# Patient Record
Sex: Female | Born: 1995 | Race: White | Hispanic: No | Marital: Single | Smoking: Never smoker
Health system: Southern US, Community
[De-identification: ages and names within clinical notes are randomized; demographics above are authoritative.]

---

## 2015-01-07 ENCOUNTER — Ambulatory Visit (INDEPENDENT_AMBULATORY_CARE_PROVIDER_SITE_OTHER): Payer: 59 | Admitting: Women's Health

## 2015-01-07 ENCOUNTER — Encounter: Payer: Self-pay | Admitting: Women's Health

## 2015-01-07 VITALS — BP 124/78 | Ht 60.0 in | Wt 155.0 lb

## 2015-01-07 DIAGNOSIS — Z30019 Encounter for initial prescription of contraceptives, unspecified: Secondary | ICD-10-CM

## 2015-01-07 DIAGNOSIS — Z113 Encounter for screening for infections with a predominantly sexual mode of transmission: Secondary | ICD-10-CM | POA: Diagnosis not present

## 2015-01-07 DIAGNOSIS — N920 Excessive and frequent menstruation with regular cycle: Secondary | ICD-10-CM | POA: Diagnosis not present

## 2015-01-07 DIAGNOSIS — Z01419 Encounter for gynecological examination (general) (routine) without abnormal findings: Secondary | ICD-10-CM

## 2015-01-07 LAB — CBC WITH DIFFERENTIAL/PLATELET
BASOS ABS: 0.1 10*3/uL (ref 0.0–0.1)
BASOS PCT: 1 % (ref 0–1)
EOS ABS: 0.3 10*3/uL (ref 0.0–0.7)
Eosinophils Relative: 5 % (ref 0–5)
HEMATOCRIT: 40.8 % (ref 36.0–46.0)
Hemoglobin: 13.9 g/dL (ref 12.0–15.0)
Lymphocytes Relative: 29 % (ref 12–46)
Lymphs Abs: 1.9 10*3/uL (ref 0.7–4.0)
MCH: 29.4 pg (ref 26.0–34.0)
MCHC: 34.1 g/dL (ref 30.0–36.0)
MCV: 86.3 fL (ref 78.0–100.0)
MONO ABS: 0.6 10*3/uL (ref 0.1–1.0)
MONOS PCT: 9 % (ref 3–12)
MPV: 9.6 fL (ref 8.6–12.4)
NEUTROS PCT: 56 % (ref 43–77)
Neutro Abs: 3.7 10*3/uL (ref 1.7–7.7)
PLATELETS: 285 10*3/uL (ref 150–400)
RBC: 4.73 MIL/uL (ref 3.87–5.11)
RDW: 13.1 % (ref 11.5–15.5)
WBC: 6.6 10*3/uL (ref 4.0–10.5)

## 2015-01-07 LAB — TSH: TSH: 0.495 u[IU]/mL (ref 0.350–4.500)

## 2015-01-07 MED ORDER — ETONOGESTREL-ETHINYL ESTRADIOL 0.12-0.015 MG/24HR VA RING: VAGINAL_RING | VAGINAL | Status: AC

## 2015-01-07 NOTE — Patient Instructions (Signed)

## 2015-01-07 NOTE — Progress Notes (Signed)
Darrell Jewelshley Tortorelli [birth year] [id number]    History:    Presents for annual exam.  Monthly heavy 7-8 day cycle. Cycles always have been long, did not tolerate pills well. Gardasil series completed. New partner in the past year.  Past medical history, past surgical history, family history and social history were all reviewed and documented in the EPIC chart. Student at [employer] undecided major. Parents healthy.  ROS:  A ROS was performed and pertinent positives and negatives are included.  Exam:  Filed Vitals:   01/07/15 1138  BP: 124/78    General appearance:  Normal Thyroid:  Symmetrical, normal in size, without palpable masses or nodularity. Respiratory  Auscultation:  Clear without wheezing or rhonchi Cardiovascular  Auscultation:  Regular rate, without rubs, murmurs or gallops  Edema/varicosities:  Not grossly evident Abdominal  Soft,nontender, without masses, guarding or rebound.  Liver/spleen:  No organomegaly noted  Hernia:  None appreciated  Skin  Inspection:  Grossly normal   Breasts: Examined lying and sitting.     Right: Without masses, retractions, discharge or axillary adenopathy.     Left: Without masses, retractions, discharge or axillary adenopathy. Gentitourinary   Inguinal/mons:  Normal without inguinal adenopathy  External genitalia:  Normal  BUS/Urethra/Skene's glands:  Normal  Vagina:  Normal  Cervix:  Normal  Uterus:   normal in size, shape and contour.  Midline and mobile  Adnexa/parametria:     Rt: Without masses or tenderness.   Lt: Without masses or tenderness.  Anus and perineum: Normal   Assessment/Plan:  19 y.o. S [place] G0 for annual exam.    Menorrhagia/condoms STD screen  Plan: Contraception options reviewed, will try NuvaRing, prescription, proper use, slight risk for blood clots and strokes reviewed. Start days 5 of next cycle continue condom use. Instructed to call if cycles do not lighten after several months. SBE's, regular exercise,  calcium rich diet, MVI daily encouraged. Campus and driving safety reviewed. CBC, TSH, prolactin, UA, GC/Chlamydia, HIV, hep B, C, RPR.    Harrington ChallengerYOUNG,Ameila Weldon J WHNP, 1:04 PM 01/07/2015

## 2015-01-08 LAB — HEPATITIS C ANTIBODY: HCV Ab: NEGATIVE

## 2015-01-08 LAB — PROLACTIN: PROLACTIN: 5.7 ng/mL

## 2015-01-08 LAB — HIV ANTIBODY (ROUTINE TESTING W REFLEX): HIV 1&2 Ab, 4th Generation: NONREACTIVE

## 2015-01-08 LAB — RPR

## 2015-01-08 LAB — HEPATITIS B SURFACE ANTIGEN: Hepatitis B Surface Ag: NEGATIVE

## 2015-01-09 LAB — GC/CHLAMYDIA PROBE AMP
CT Probe RNA: NEGATIVE
GC PROBE AMP APTIMA: NEGATIVE

## 2015-04-24 ENCOUNTER — Other Ambulatory Visit: Payer: Self-pay | Admitting: Gastroenterology

## 2015-04-24 DIAGNOSIS — R1084 Generalized abdominal pain: Secondary | ICD-10-CM

## 2015-04-29 ENCOUNTER — Ambulatory Visit
Admission: RE | Admit: 2015-04-29 | Discharge: 2015-04-29 | Disposition: A | Discharge status: Home / Self Care | Payer: 59 | Source: Ambulatory Visit | Attending: Gastroenterology | Admitting: Gastroenterology

## 2015-04-29 DIAGNOSIS — R1084 Generalized abdominal pain: Secondary | ICD-10-CM

## 2016-08-12 IMAGING — US US ABDOMEN COMPLETE
1 series · 14 of 25 positions shown · non-contrast
Comparison: None.

CLINICAL DATA: Abdominal pain.

EXAM:
ULTRASOUND ABDOMEN COMPLETE

[Series 1: us abdomen complete · 0.19mm/px · 14 of 78 slices shown]
[im 1/78]
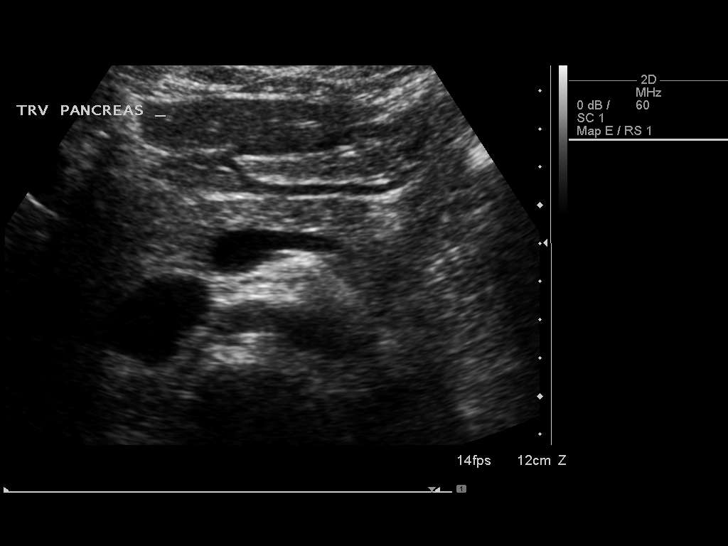
[im 7/78]
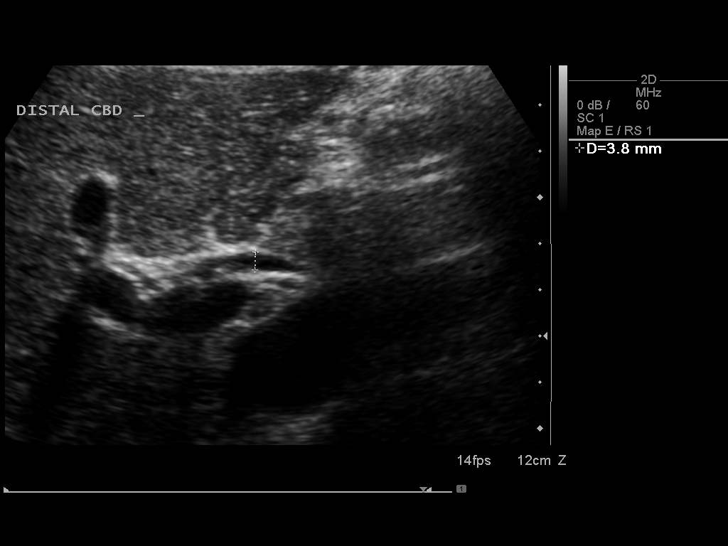
[im 13/78]
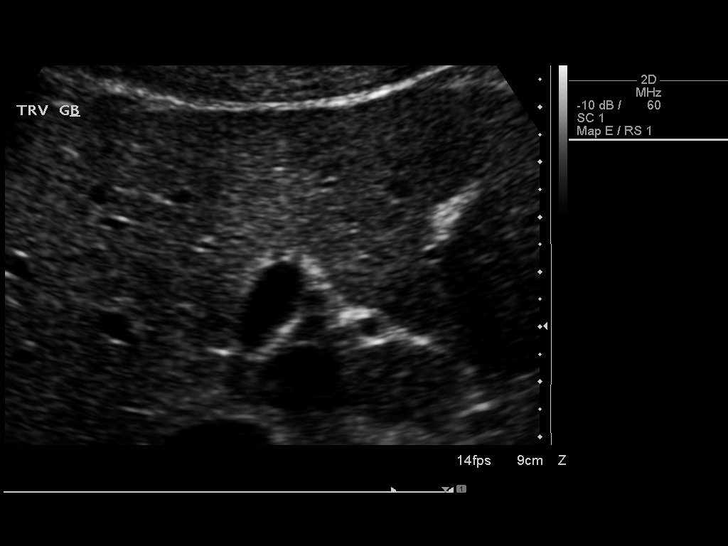
[im 20/78]
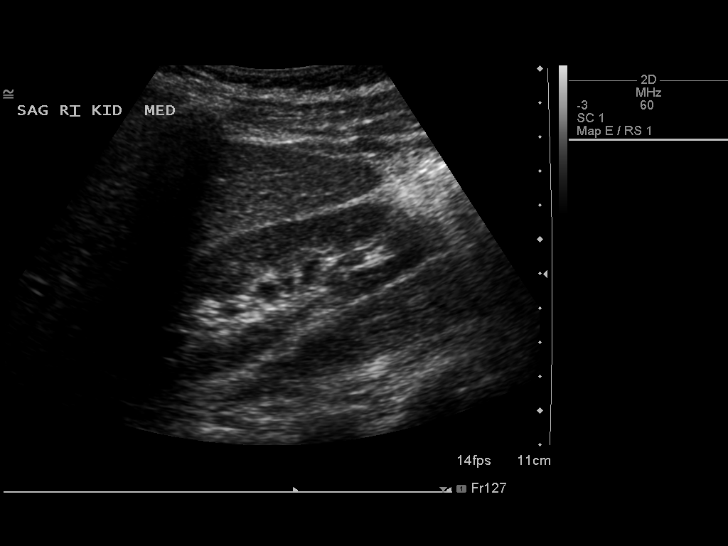
[im 26/78]
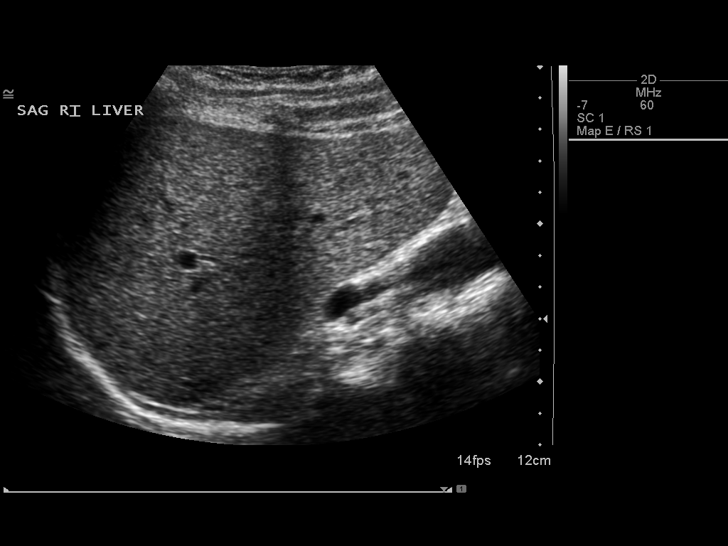
[im 29/78]
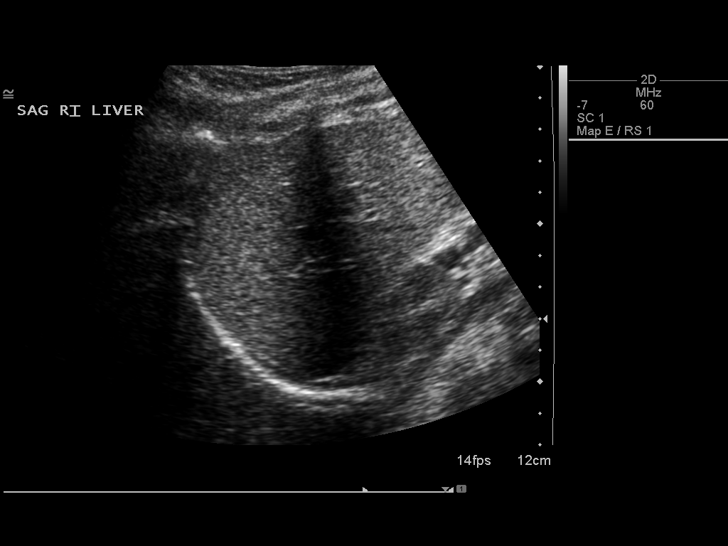
[im 36/78]
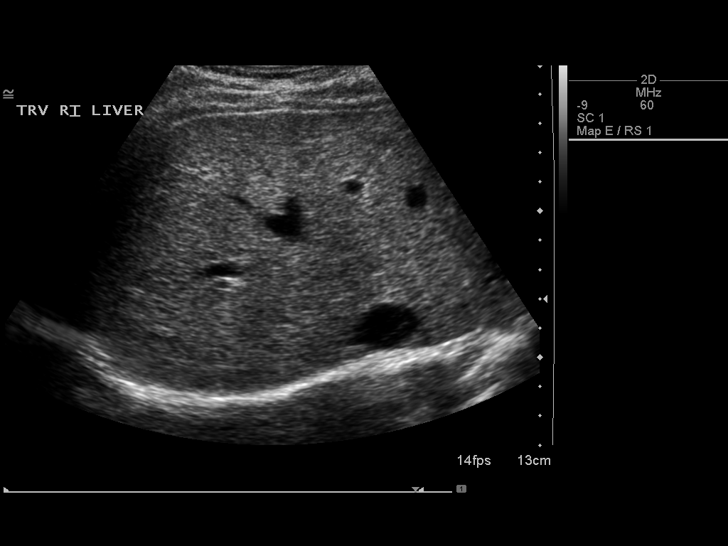
[im 42/78]
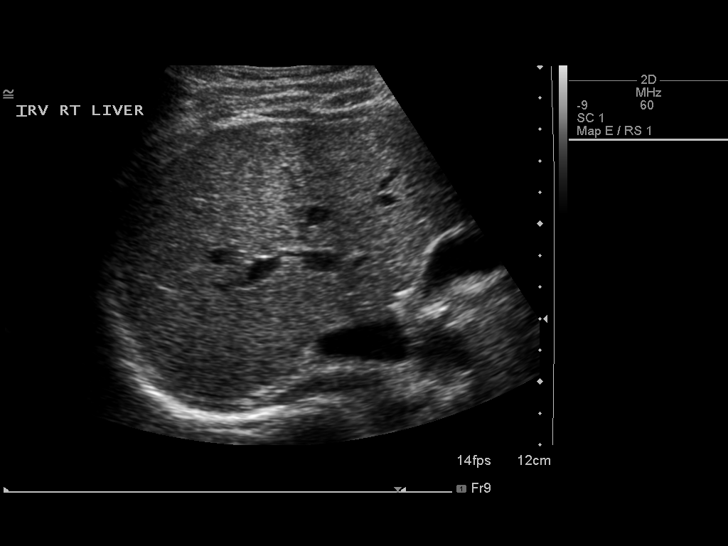
[im 49/78]
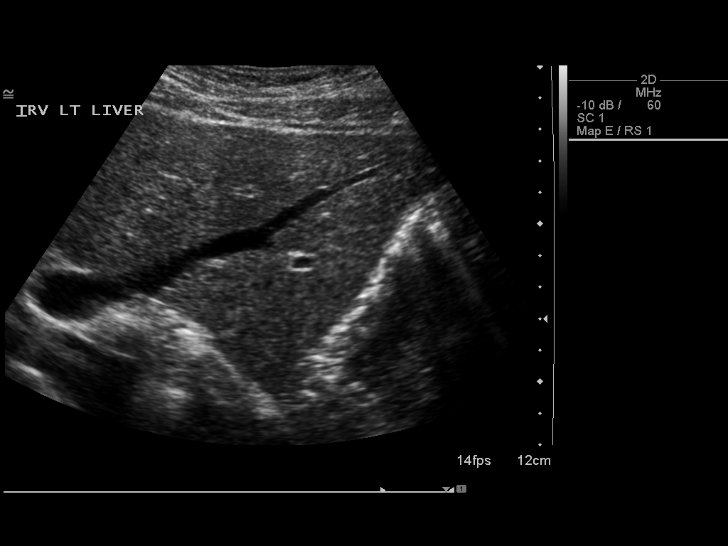
[im 52/78]
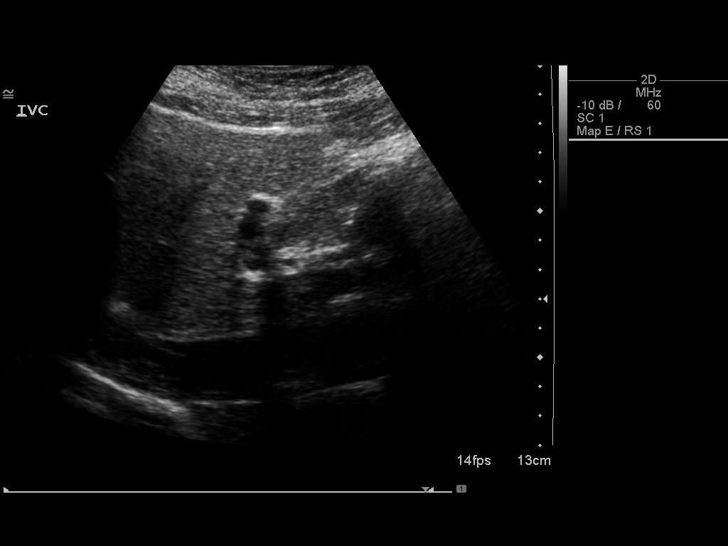
[im 58/78]
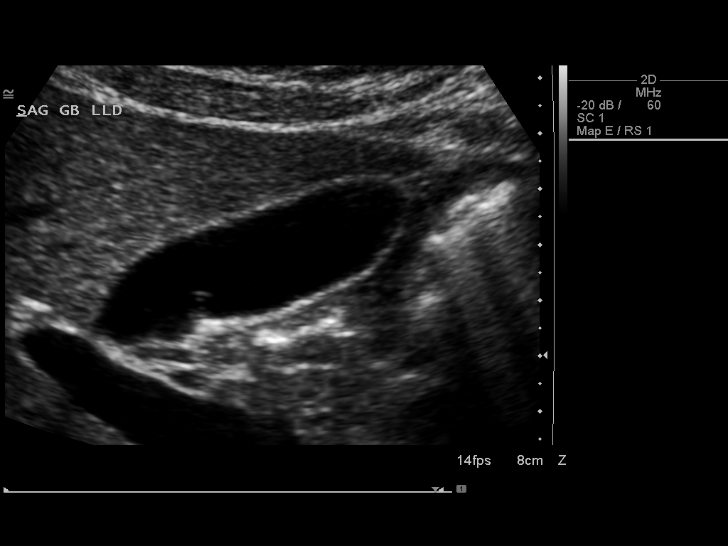
[im 65/78]
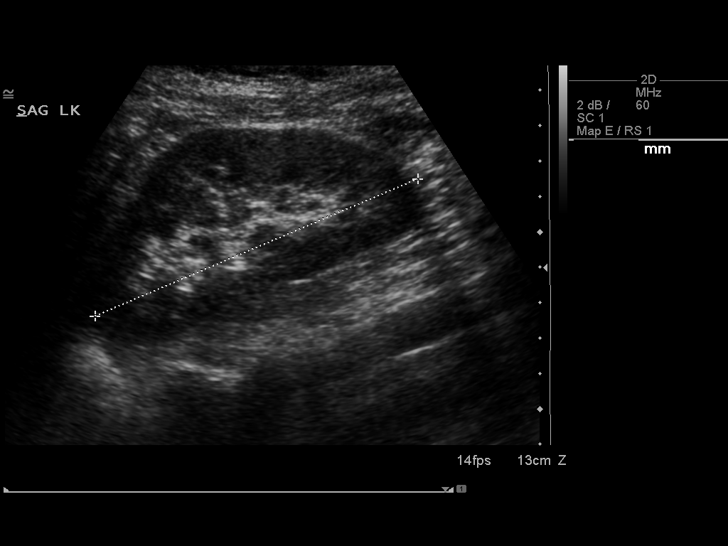
[im 71/78]
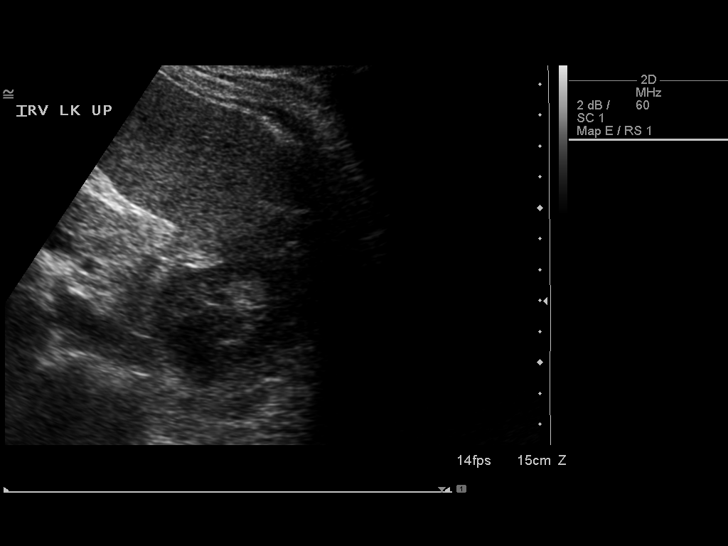
[im 78/78]
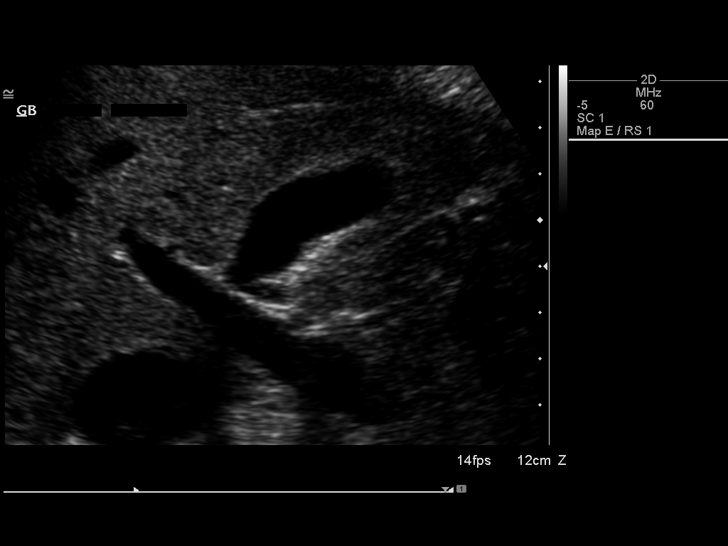

[14 of 25 positions shown; findings below may reference images not displayed]

FINDINGS: Gallbladder: No gallstones or wall thickening visualized. No
sonographic Murphy sign noted.

Common bile duct: Diameter: 1.3 cm

Liver: No focal lesion identified. Within normal limits in
parenchymal echogenicity.

IVC: No abnormality visualized.

Pancreas: Visualized portion unremarkable.

Spleen: Size and appearance within normal limits.

Right Kidney: Length: 11.6 cm. Echogenicity within normal limits. No
mass or hydronephrosis visualized.

Left Kidney: Length: 10.2 cm. Echogenicity within normal limits. No
mass or hydronephrosis visualized.

Abdominal aorta: No aneurysm visualized.

Other findings: None.
IMPRESSION: Negative exam.

## 2017-01-18 ENCOUNTER — Encounter: Payer: Self-pay | Admitting: Gynecology
